# Patient Record
Sex: Female | Born: 1995 | Race: White | Hispanic: No | Marital: Single | Smoking: Never smoker
Health system: Southern US, Community
[De-identification: ages and names within clinical notes are randomized; demographics above are authoritative.]

## PROBLEM LIST (undated history)

## (undated) DIAGNOSIS — I10 Essential (primary) hypertension: Secondary | ICD-10-CM

---
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
## 2021-12-10 ENCOUNTER — Emergency Department: Payer: Managed Care, Other (non HMO)

## 2021-12-10 ENCOUNTER — Other Ambulatory Visit: Payer: Self-pay

## 2021-12-10 ENCOUNTER — Emergency Department
Admission: EM | Admit: 2021-12-10 | Discharge: 2021-12-10 | Disposition: A | Discharge status: Home / Self Care | Payer: Managed Care, Other (non HMO) | Attending: Emergency Medicine | Admitting: Emergency Medicine

## 2021-12-10 ENCOUNTER — Encounter: Payer: Self-pay | Admitting: Emergency Medicine

## 2021-12-10 DIAGNOSIS — Z20822 Contact with and (suspected) exposure to covid-19: Secondary | ICD-10-CM | POA: Insufficient documentation

## 2021-12-10 DIAGNOSIS — J029 Acute pharyngitis, unspecified: Secondary | ICD-10-CM | POA: Diagnosis present

## 2021-12-10 DIAGNOSIS — I10 Essential (primary) hypertension: Secondary | ICD-10-CM | POA: Insufficient documentation

## 2021-12-10 DIAGNOSIS — N9489 Other specified conditions associated with female genital organs and menstrual cycle: Secondary | ICD-10-CM | POA: Insufficient documentation

## 2021-12-10 DIAGNOSIS — J039 Acute tonsillitis, unspecified: Secondary | ICD-10-CM | POA: Diagnosis not present

## 2021-12-10 HISTORY — DX: Essential (primary) hypertension: I10

## 2021-12-10 LAB — CBC WITH DIFFERENTIAL/PLATELET
Abs Immature Granulocytes: 0.05 10*3/uL (ref 0.00–0.07)
Basophils Absolute: 0 10*3/uL (ref 0.0–0.1)
Basophils Relative: 0 %
Eosinophils Absolute: 0 10*3/uL (ref 0.0–0.5)
Eosinophils Relative: 0 %
HCT: 43 % (ref 36.0–46.0)
Hemoglobin: 15.1 g/dL — ABNORMAL HIGH (ref 12.0–15.0)
Immature Granulocytes: 0 %
Lymphocytes Relative: 10 %
Lymphs Abs: 1.1 10*3/uL (ref 0.7–4.0)
MCH: 29.2 pg (ref 26.0–34.0)
MCHC: 35.1 g/dL (ref 30.0–36.0)
MCV: 83 fL (ref 80.0–100.0)
Monocytes Absolute: 0.6 10*3/uL (ref 0.1–1.0)
Monocytes Relative: 6 %
Neutro Abs: 9.4 10*3/uL — ABNORMAL HIGH (ref 1.7–7.7)
Neutrophils Relative %: 84 %
Platelets: 239 10*3/uL (ref 150–400)
RBC: 5.18 MIL/uL — ABNORMAL HIGH (ref 3.87–5.11)
RDW: 11.9 % (ref 11.5–15.5)
WBC: 11.3 10*3/uL — ABNORMAL HIGH (ref 4.0–10.5)
nRBC: 0 % (ref 0.0–0.2)

## 2021-12-10 LAB — BASIC METABOLIC PANEL
Anion gap: 7 (ref 5–15)
BUN: 7 mg/dL (ref 6–20)
CO2: 26 mmol/L (ref 22–32)
Calcium: 9 mg/dL (ref 8.9–10.3)
Chloride: 102 mmol/L (ref 98–111)
Creatinine, Ser: 0.55 mg/dL (ref 0.44–1.00)
GFR, Estimated: >60 mL/min (ref >60)
Glucose, Bld: 124 mg/dL — ABNORMAL HIGH (ref 70–99)
Potassium: 3.8 mmol/L (ref 3.5–5.1)
Sodium: 135 mmol/L (ref 135–145)

## 2021-12-10 LAB — RESP PANEL BY RT-PCR (FLU A&B, COVID) ARPGX2
Influenza A by PCR: NEGATIVE
Influenza B by PCR: NEGATIVE
SARS Coronavirus 2 by RT PCR: NEGATIVE

## 2021-12-10 LAB — HCG, QUANTITATIVE, PREGNANCY: hCG, Beta Chain, Quant, S: <1 m[IU]/mL (ref <5)

## 2021-12-10 MED ORDER — IOHEXOL 300 MG/ML  SOLN
75.0000 mL | Freq: Once | INTRAMUSCULAR | Status: AC | PRN
  Administered 2021-12-10: 14:00:00 75 mL via INTRAVENOUS
  Filled 2021-12-10: qty 75

## 2021-12-10 MED ORDER — DEXAMETHASONE SODIUM PHOSPHATE 10 MG/ML IJ SOLN
12.0000 mg | Freq: Once | INTRAMUSCULAR | Status: AC
  Administered 2021-12-10: 14:00:00 12 mg via INTRAVENOUS
  Filled 2021-12-10: qty 2

## 2021-12-10 MED ORDER — AMOXICILLIN-POT CLAVULANATE 875-125 MG PO TABS: 1.0000 | ORAL_TABLET | Freq: Two times a day (BID) | ORAL | 0 refills | Status: AC

## 2021-12-10 MED ORDER — KETOROLAC TROMETHAMINE 30 MG/ML IJ SOLN
30.0000 mg | Freq: Once | INTRAMUSCULAR | Status: AC
  Administered 2021-12-10: 14:00:00 30 mg via INTRAVENOUS
  Filled 2021-12-10: qty 1

## 2021-12-10 NOTE — ED Provider Notes (Signed)
[facility] Emergency Department Provider Note    ____________________________________________   Event Date/Time   First MD Initiated Contact with Patient 12/10/21 1216     (approximate)  I have reviewed the triage vital signs and the nursing notes.   HISTORY  Chief Complaint Sore Throat   HPI Erika Acosta is a 25 y.o. female, history of hypertension, presents to the emergency department for evaluation of sore throat.  Patient states that she was treated for strep throat a few weeks ago.  Her infection resolved with a course of amoxicillin, however she states that her infection has returned and she continues to have tonsillar swelling.  Additionally reports chills and body aches.  Denies chest pain, shortness of breath, abdominal pain, or urinary symptoms.  History limited by: No limitations  Past Medical History:  Diagnosis Date   Hypertension     There are no problems to display for this patient.   History reviewed. No pertinent surgical history.  Prior to Admission medications   Medication Sig Start Date End Date Taking? Authorizing Provider  amoxicillin-clavulanate (AUGMENTIN) 875-125 MG tablet Take 1 tablet by mouth 2 (two) times daily for 10 days. 12/10/21 12/20/21 Yes Varney Daily, PA    Allergies Patient has no allergy information on record.  No family history on file.  Social History    Review of Systems  Constitutional: Positive for fever/chills negative for weight loss, or fatigue.  Eyes: Negative for visual changes or discharge.  ENT: Positive for sore throat negative for congestion Gastrointestinal: Negative for abdominal pain, nausea/vomiting, or diarrhea.  Genitourinary: Negative for dysuria or hematuria.  Musculoskeletal: Negative for back pain or joint pain.  Skin: Negative for rashes or lesions.  Neurological: Negative for headache, syncope, dizziness, tremors, or numbness/tingling.   10-point ROS otherwise  negative. ____________________________________________   PHYSICAL EXAM:  VITAL SIGNS: ED Triage Vitals  Enc Vitals Group     BP 12/10/21 1217 (!) 189/134     Pulse Rate 12/10/21 1217 (!) 142     Resp 12/10/21 1217 20     Temp --      Temp src --      SpO2 12/10/21 1217 99 %     Weight 12/10/21 1215 300 lb (136.1 kg)     Height 12/10/21 1215 5\' 8"  (1.727 m)     Head Circumference --      Peak Flow --      Pain Score 12/10/21 1214 6     Pain Loc --      Pain Edu? --      Excl. in GC? --     Physical Exam Vitals and nursing note reviewed.  Constitutional:      General: She is not in acute distress.    Appearance: Normal appearance. She is not ill-appearing.  HENT:     Head: Normocephalic and atraumatic.     Right Ear: External ear normal.     Left Ear: External ear normal.     Nose: Nose normal.     Mouth/Throat:     Mouth: Mucous membranes are moist.     Pharynx: Oropharynx is clear.     Comments: Significant tonsillar swelling bilaterally with some exudates.  Uvula midline. Eyes:     Conjunctiva/sclera: Conjunctivae normal.     Pupils: Pupils are equal, round, and reactive to light.  Cardiovascular:     Rate and Rhythm: Normal rate and regular rhythm.  Pulmonary:     Effort: Pulmonary effort is  normal. No respiratory distress.     Breath sounds: Normal breath sounds. No wheezing, rhonchi or rales.  Abdominal:     General: Abdomen is flat. There is no distension.     Palpations: Abdomen is soft.  Musculoskeletal:        General: No deformity. Normal range of motion.     Cervical back: Normal range of motion and neck supple.  Skin:    General: Skin is warm and dry.  Neurological:     General: No focal deficit present.     Mental Status: She is alert. Mental status is at baseline.  Psychiatric:        Mood and Affect: Mood normal.        Behavior: Behavior normal.        Thought Content: Thought content normal.        Judgment: Judgment normal.      ____________________________________________    LABS  (all labs ordered are listed, but only abnormal results are displayed)  Labs Reviewed  BASIC METABOLIC PANEL - Abnormal; Notable for the following components:      Result Value   Glucose, Bld 124 (*)    All other components within normal limits  CBC WITH DIFFERENTIAL/PLATELET - Abnormal; Notable for the following components:   WBC 11.3 (*)    RBC 5.18 (*)    Hemoglobin 15.1 (*)    Neutro Abs 9.4 (*)    All other components within normal limits  RESP PANEL BY RT-PCR (FLU A&B, COVID) ARPGX2  HCG, QUANTITATIVE, PREGNANCY     ____________________________________________   EKG Not applicable.   ____________________________________________    RADIOLOGY I personally viewed and evaluated these images as part of my medical decision making, as well as reviewing the written report by the radiologist.  ED Provider Interpretation: I agree with the interpretation of the radiologist.  CT Soft Tissue Neck W Contrast  Result Date: 12/10/2021 CLINICAL DATA:  Epiglottitis or tonsillitis suspected EXAM: CT NECK WITH CONTRAST TECHNIQUE: Multidetector CT imaging of the neck was performed using the standard protocol following the bolus administration of intravenous contrast. CONTRAST:  51mL OMNIPAQUE IOHEXOL 300 MG/ML  SOLN COMPARISON:  None. FINDINGS: Pharynx and larynx: The nasal cavity and nasopharynx are unremarkable. The bilateral palatine tonsils are swollen, right more than left. There is no peripherally enhancing fluid collection to suggest organized abscess formation. There is partial effacement of the oropharyngeal airway. The hypopharynx and larynx are unremarkable. The vocal folds are normal. The parapharyngeal spaces are clear. Salivary glands: The parotid and submandibular glands are unremarkable. Thyroid: Unremarkable. Lymph nodes: There are prominent bilateral cervical chain lymph nodes measuring up to 1.6 cm on the right  and 1.4 cm on the left at level IIA, likely reactive. Vascular: Unremarkable. Limited intracranial: The imaged intracranial compartment is unremarkable. Visualized orbits: Globes and orbits are unremarkable. Mastoids and visualized paranasal sinuses: The paranasal sinuses are clear. The mastoid air cells are clear. Skeleton: There is no acute osseous abnormality or aggressive osseous lesion. Upper chest: The imaged lung apices are clear. Other: None. IMPRESSION: Enlarged bilateral palatine tonsils suggesting tonsillitis in the appropriate clinical setting. No evidence of abscess formation. Scattered enlarged bilateral cervical chain lymph nodes are likely reactive. Electronically Signed   By: Valetta Mole M.D.   On: 12/10/2021 14:39    ____________________________________________   PROCEDURES  Procedures   Medications  ketorolac (TORADOL) 30 MG/ML injection 30 mg (30 mg Intravenous Given 12/10/21 1357)  dexamethasone (DECADRON) injection 12 mg (12  mg Intravenous Given 12/10/21 1357)  iohexol (OMNIPAQUE) 300 MG/ML solution 75 mL (75 mLs Intravenous Contrast Given 12/10/21 1413)    Critical Care performed: No  ____________________________________________   INITIAL IMPRESSION / ASSESSMENT AND PLAN / ED COURSE  Pertinent labs & imaging results that were available during my care of the patient were reviewed by me and considered in my medical decision making (see chart for details).        Erika Acosta is a 25 y.o. female, history of hypertension, presents to the emergency department for evaluation of sore throat.  Patient states that she was treated for strep throat a few weeks ago.  Her infection resolved with a course of amoxicillin, however she states that her infection has returned and she continues to have tonsillar swelling.  Additionally reports chills and body aches.  Denies chest pain, shortness of breath, abdominal pain, or urinary symptoms.  Differentials include, but not limited  to: Serious: epiglottitis, peritonsillar abscess, retropharyngeal abscess, Ludwig angina, gonorrhea/chlamydia Common: viral pharyngitis, strep pharyngitis, mononucleosis, allergies, viral syndrome (influenza, COVID), reflux esophagitis   Upon entering the room, patient appears well.  She is sitting upright comfortably in the bed.  No apparent distress.  Physical exam is notable for significant tonsillar swelling bilaterally with some exudates.  Uvula midline.  Patient is hypertensive at 162/90 and mildly tachycardic at 105.  She is afebrile here today.  Patient was initially treated with 30 mg ketorolac and 12 mg dexamethasone.  CBC shows leukocytosis at 11.3.  BMP unremarkable.  Negative beta hCG.  Respiratory panel is negative for COVID-19 or influenza.  Patient was previously screened for mono and chlamydia/gonorrhea, which were all negative.  CT soft tissue neck with contrast shows tonsillar swelling.  No evidence of abscess formation.  Given the patient's history, physical exam, labs, and imaging, I suspect that this patient is having a recurring strep pharyngitis infection.  I discussed the findings with the patient.  We will plan to discharge this patient with a prescription for Augmentin.  Provide the patient with anticipatory guidance and strict return precautions.  I encouraged the patient to return to the emergency department at any time if her symptoms worsen or she begins to experience any new symptoms      ____________________________________________   FINAL CLINICAL IMPRESSION(S) / ED DIAGNOSES  Final diagnoses:  Tonsillitis     NEW MEDICATIONS STARTED DURING THIS VISIT:  ED Discharge Orders          Ordered    amoxicillin-clavulanate (AUGMENTIN) 875-125 MG tablet  2 times daily        12/10/21 1518             Note:  This document was prepared using Dragon voice recognition software and may include unintentional dictation errors.    Teodoro Spray,  [place] 12/11/21 [id number]    Lucrezia Starch, MD 12/11/21 1034

## 2021-12-10 NOTE — ED Triage Notes (Signed)
Pt reports had strep throat and was given amoxicillin for 10 days. Pt reports was better but then her sore throat came back and now she has bodyaches.

## 2021-12-10 NOTE — ED Notes (Signed)
Patient requests to hold off on the strep swab until she speaks with a provider.

## 2021-12-10 NOTE — ED Notes (Signed)
Pt to ED for 2/5 week group A strep throat with now swollen tonsil R neck. States tonsil swelling has been since 11/30 with swelling up and down but never resolved.  Pt states EDP told her we do not need to swab again. Will confirm with EDP.

## 2021-12-10 NOTE — ED Notes (Signed)
Lab called to add on hCG to blood work already sent

## 2021-12-10 NOTE — Discharge Instructions (Addendum)
-  Please return to the emergency department if you experience new or worsening symptoms. -Please take antibiotics as prescribed and finish the entire course. -Take Tylenol or ibuprofen as needed for pain.

## 2022-02-22 ENCOUNTER — Other Ambulatory Visit: Payer: Self-pay | Admitting: Student

## 2022-02-22 DIAGNOSIS — I998 Other disorder of circulatory system: Secondary | ICD-10-CM

## 2022-02-23 ENCOUNTER — Other Ambulatory Visit
Admission: RE | Admit: 2022-02-23 | Discharge: 2022-02-23 | Disposition: A | Discharge status: Home / Self Care | Payer: Managed Care, Other (non HMO) | Source: Ambulatory Visit | Attending: Student | Admitting: Student

## 2022-02-23 DIAGNOSIS — Z03818 Encounter for observation for suspected exposure to other biological agents ruled out: Secondary | ICD-10-CM | POA: Diagnosis present

## 2022-02-23 DIAGNOSIS — R000 Tachycardia, unspecified: Secondary | ICD-10-CM | POA: Insufficient documentation

## 2022-02-23 LAB — TROPONIN I (HIGH SENSITIVITY): Troponin I (High Sensitivity): <2 ng/L (ref <18)

## 2022-02-23 LAB — D-DIMER, QUANTITATIVE: D-Dimer, Quant: 0.45 ug/mL-FEU (ref 0.00–0.50)

## 2022-02-28 ENCOUNTER — Other Ambulatory Visit: Payer: Self-pay

## 2022-02-28 ENCOUNTER — Ambulatory Visit
Admission: RE | Admit: 2022-02-28 | Discharge: 2022-02-28 | Disposition: A | Discharge status: Home / Self Care | Payer: Managed Care, Other (non HMO) | Source: Ambulatory Visit | Attending: Student | Admitting: Student

## 2022-02-28 DIAGNOSIS — I998 Other disorder of circulatory system: Secondary | ICD-10-CM | POA: Insufficient documentation

## 2022-09-10 ENCOUNTER — Emergency Department
Admission: EM | Admit: 2022-09-10 | Discharge: 2022-09-10 | Disposition: A | Discharge status: Home / Self Care | Payer: Managed Care, Other (non HMO) | Attending: Emergency Medicine | Admitting: Emergency Medicine

## 2022-09-10 ENCOUNTER — Encounter: Payer: Self-pay | Admitting: Emergency Medicine

## 2022-09-10 ENCOUNTER — Other Ambulatory Visit: Payer: Self-pay

## 2022-09-10 DIAGNOSIS — R55 Syncope and collapse: Secondary | ICD-10-CM

## 2022-09-10 DIAGNOSIS — I10 Essential (primary) hypertension: Secondary | ICD-10-CM | POA: Diagnosis not present

## 2022-09-10 LAB — CBC
HCT: 40.3 % (ref 36.0–46.0)
Hemoglobin: 13.6 g/dL (ref 12.0–15.0)
MCH: 29.7 pg (ref 26.0–34.0)
MCHC: 33.7 g/dL (ref 30.0–36.0)
MCV: 88 fL (ref 80.0–100.0)
Platelets: 239 10*3/uL (ref 150–400)
RBC: 4.58 MIL/uL (ref 3.87–5.11)
RDW: 12.6 % (ref 11.5–15.5)
WBC: 7.6 10*3/uL (ref 4.0–10.5)
nRBC: 0 % (ref 0.0–0.2)

## 2022-09-10 LAB — BASIC METABOLIC PANEL
Anion gap: 8 (ref 5–15)
BUN: 9 mg/dL (ref 6–20)
CO2: 28 mmol/L (ref 22–32)
Calcium: 9 mg/dL (ref 8.9–10.3)
Chloride: 102 mmol/L (ref 98–111)
Creatinine, Ser: 0.72 mg/dL (ref 0.44–1.00)
GFR, Estimated: >60 mL/min (ref >60)
Glucose, Bld: 107 mg/dL — ABNORMAL HIGH (ref 70–99)
Potassium: 3.8 mmol/L (ref 3.5–5.1)
Sodium: 138 mmol/L (ref 135–145)

## 2022-09-10 NOTE — ED Provider Notes (Signed)
[facility] Provider Note    Event Date/Time   First MD Initiated Contact with Patient 09/10/22 1418     (approximate)  History   Chief Complaint: Weakness and Dizziness  HPI  Erika Acosta is a 26 y.o. female with a past medical history of hypertension who presents to the emergency department for a near syncopal episode.  According to the patient she was at work today shortly after lunch when she began feeling dizzy and lightheaded.  Patient states she felt like she was, pass out so she laid down on the ground.  Patient states this has been an ongoing issue over the past year or more.  States she is followed up with her doctor as well as cardiology and currently sees Dr. Juliann Pares.  Patient states she has had a Holter monitor that has shown normal results as well as some blood work with no findings yet.  Patient denies any chest pain.  Denies any shortness of breath.  Patient currently denies any symptoms, states she feels like she is back to normal.  Physical Exam   Triage Vital Signs: ED Triage Vitals  Enc Vitals Group     BP 09/10/22 1407 (!) 141/89     Pulse Rate 09/10/22 1407 87     Resp 09/10/22 1407 20     Temp 09/10/22 1407 98 F (36.7 C)     Temp Source 09/10/22 1407 Oral     SpO2 09/10/22 1407 97 %     Weight 09/10/22 1400 270 lb (122.5 kg)     Height 09/10/22 1400 5\' 7"  (1.702 m)     Head Circumference --      Peak Flow --      Pain Score 09/10/22 1400 0     Pain Loc --      Pain Edu? --      Excl. in GC? --     Most recent vital signs: Vitals:   09/10/22 1407  BP: (!) 141/89  Pulse: 87  Resp: 20  Temp: 98 F (36.7 C)  SpO2: 97%    General: Awake, no distress.  CV:  Good peripheral perfusion.   Resp:  Normal effort.   Abd:  No distention.     ED Results / Procedures / Treatments   EKG  EKG viewed and interpreted by myself shows a normal sinus rhythm at 79 bpm with a narrow QRS, normal axis, normal intervals, no concerning  ST changes.   MEDICATIONS ORDERED IN ED: Medications - No data to display   IMPRESSION / MDM / ASSESSMENT AND PLAN / ED COURSE  I reviewed the triage vital signs and the nursing notes.  Patient's presentation is most consistent with acute presentation with potential threat to life or bodily function.  Patient presents emergency department after near syncopal episode.  Overall the patient appears well, no distress.  Patient states this has been an ongoing issue over the past 1+ years.  Patient has seen her doctor and is currently seeing a cardiologist for the work-up.  Patient's work-up today is reassuring including normal chemistry, reassuring CBC and a normal EKG.  Patient denies any chest pain or shortness of breath.  Patient appears well on my examination.  I discussed the possible causes such as a vasovagal event, dehydration, pots.  Ultimately recommended that the patient follow back up with her cardiologist.  Patient agreeable to plan.  Patient will be discharged with cardiology follow-up.  FINAL CLINICAL IMPRESSION(S) / ED DIAGNOSES  Near syncope  Note:  This document was prepared using Systems analyst and may include unintentional dictation errors.   Harvest Dark, MD 09/10/22 [phone]

## 2022-09-10 NOTE — ED Triage Notes (Signed)
Pt had near syncopal episode today after lunch. 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 Pt states this issue has been going on for months and cardiologist is unable to find out what the problem is. Pt states dizziness and weakness comes and goes everyday but today seems to be  worse.

## 2022-09-24 ENCOUNTER — Other Ambulatory Visit: Payer: Self-pay | Admitting: Physician Assistant

## 2022-09-24 DIAGNOSIS — R55 Syncope and collapse: Secondary | ICD-10-CM

## 2022-10-10 ENCOUNTER — Ambulatory Visit
Admission: RE | Admit: 2022-10-10 | Discharge: 2022-10-10 | Disposition: A | Discharge status: Home / Self Care | Payer: Managed Care, Other (non HMO) | Source: Ambulatory Visit | Attending: Physician Assistant | Admitting: Physician Assistant

## 2022-10-10 DIAGNOSIS — R55 Syncope and collapse: Secondary | ICD-10-CM

## 2023-08-25 IMAGING — US US RENAL ARTERY STENOSIS
1 series · 13 of 25 positions shown · non-contrast
Comparison: CT renal stone protocol, 11/30/2013.

CLINICAL DATA: Poorly controlled blood pressure

EXAM:
RENAL/URINARY TRACT ULTRASOUND
RENAL DUPLEX DOPPLER ULTRASOUND

[Series 1: us renal artery duplex complete · 13 of 86 slices shown]
[im 1/86]
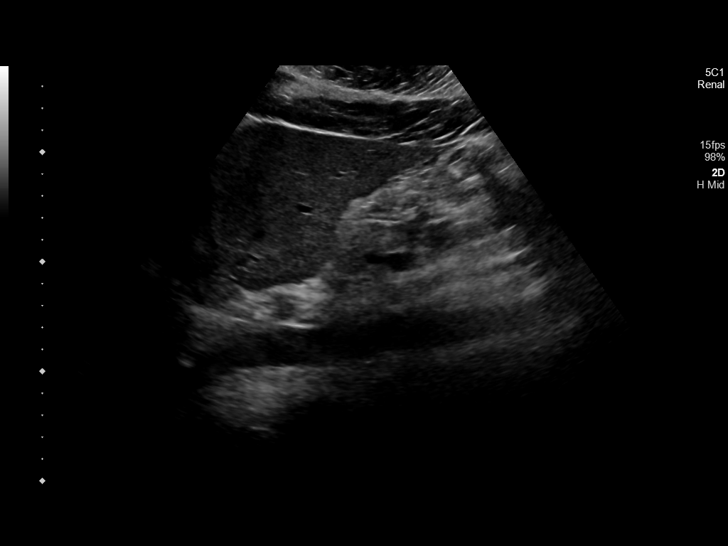
[im 8/86]
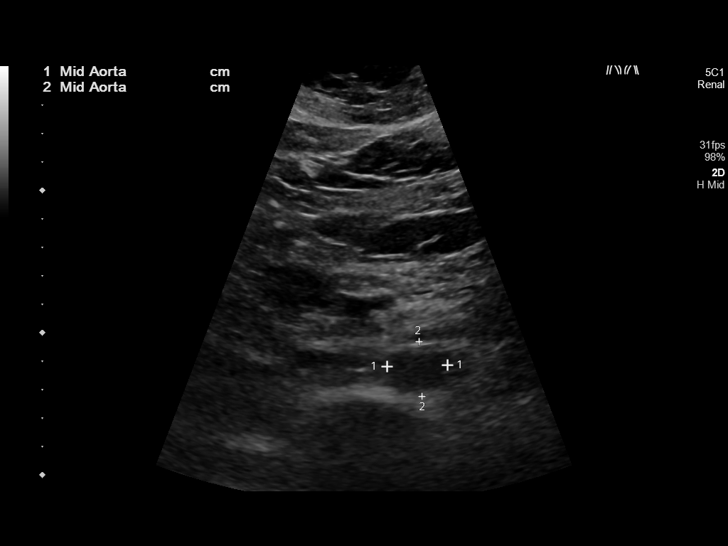
[im 15/86]
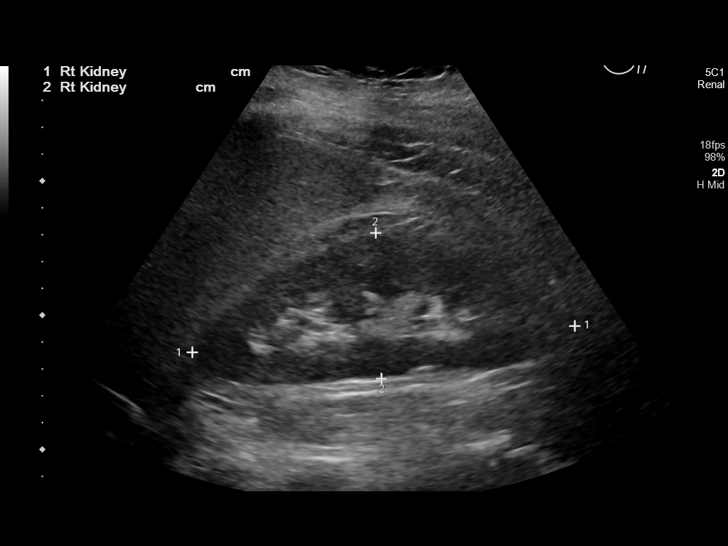
[im 22/86]
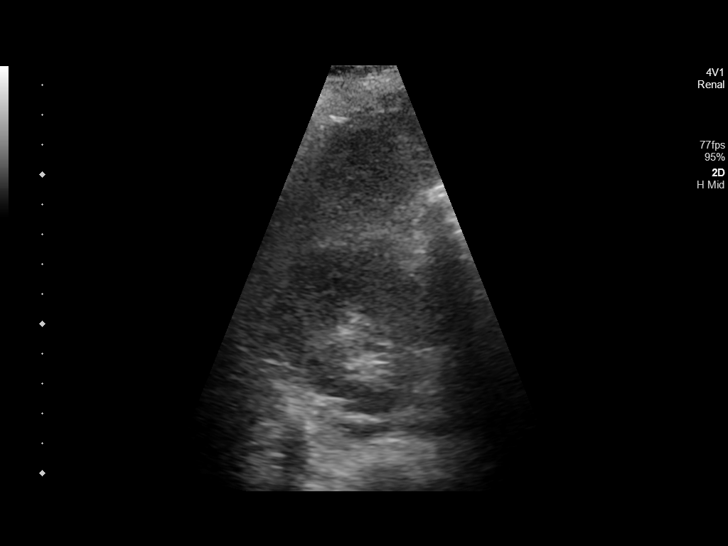
[im 29/86]
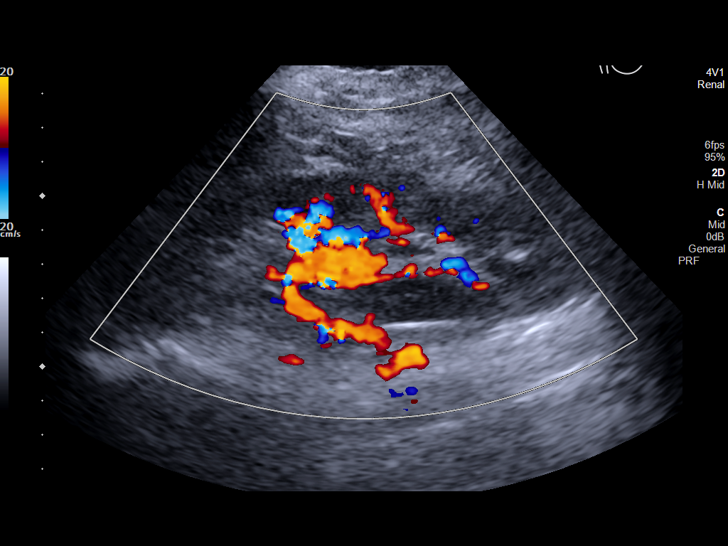
[im 36/86]
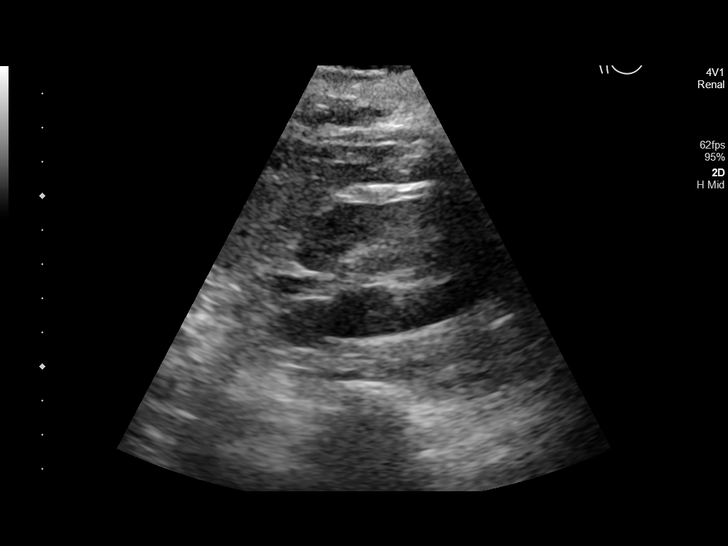
[im 43/86]
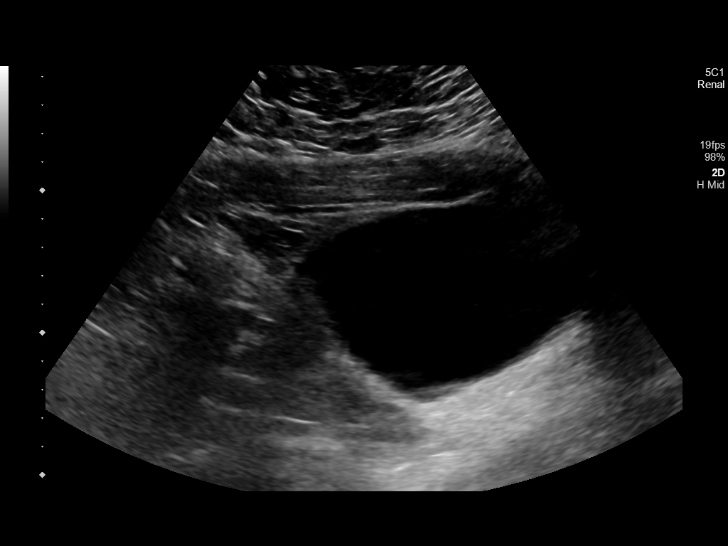
[im 50/86]
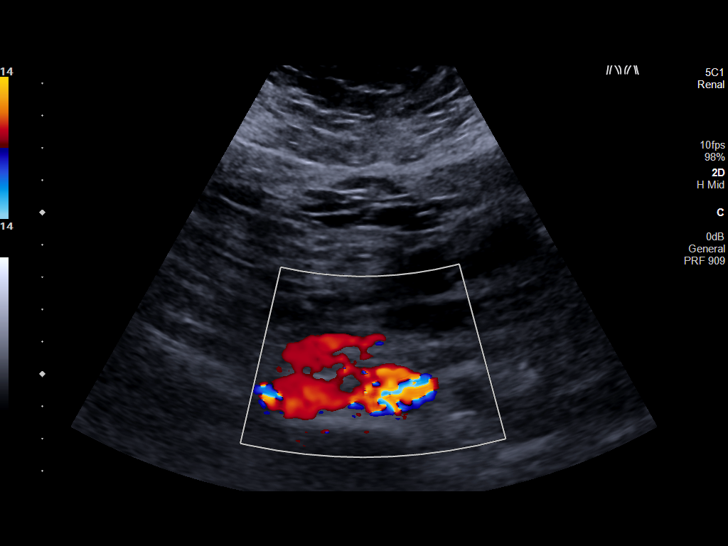
[im 57/86]
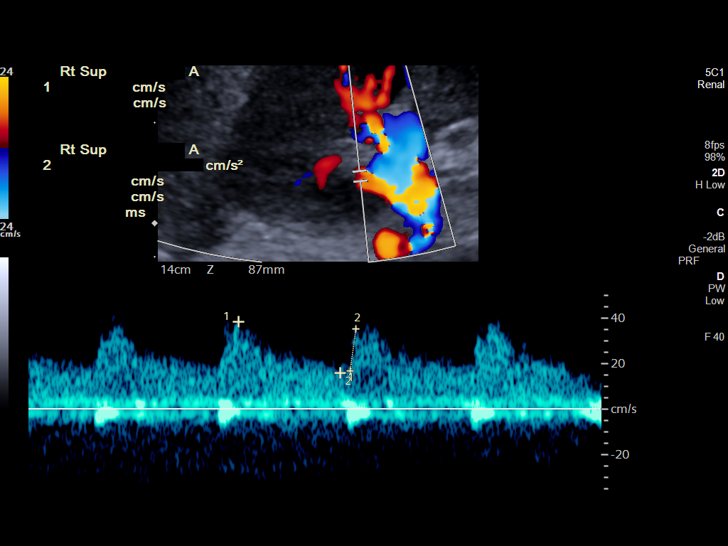
[im 64/86]
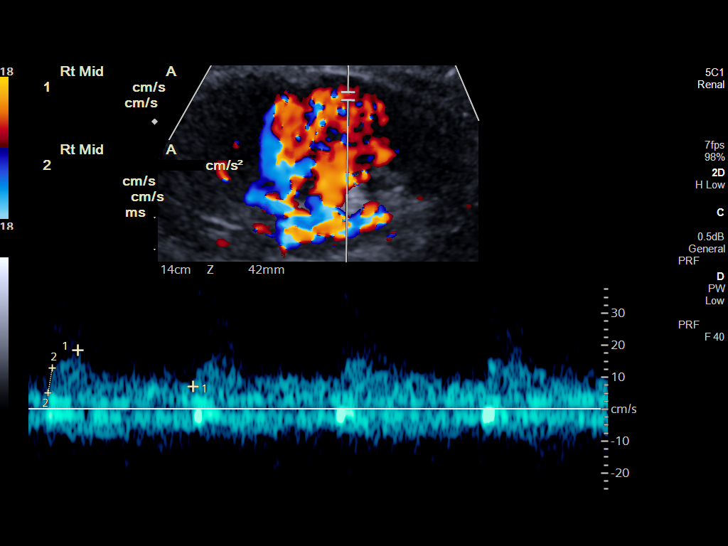
[im 71/86]
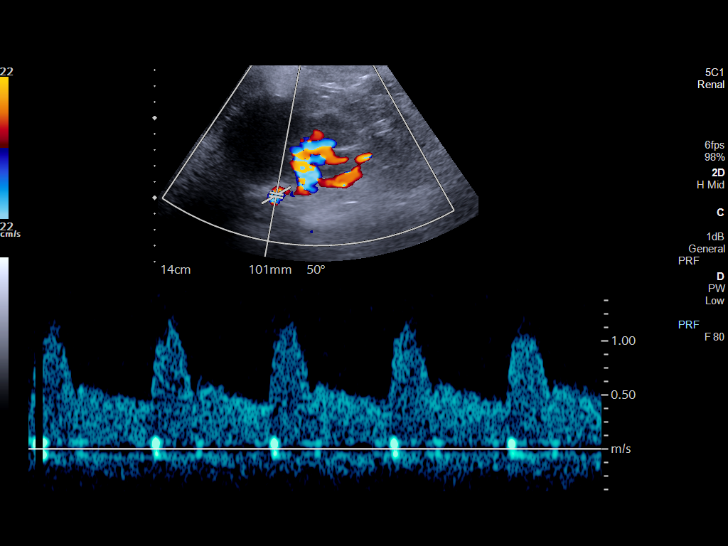
[im 78/86]
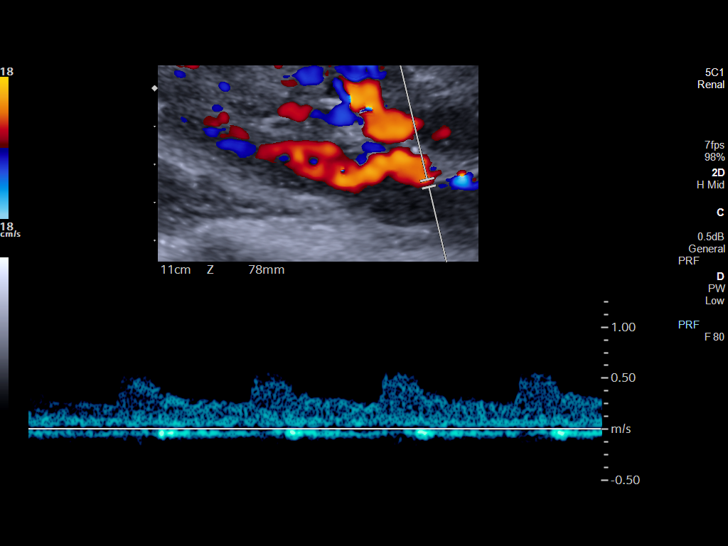
[im 86/86]
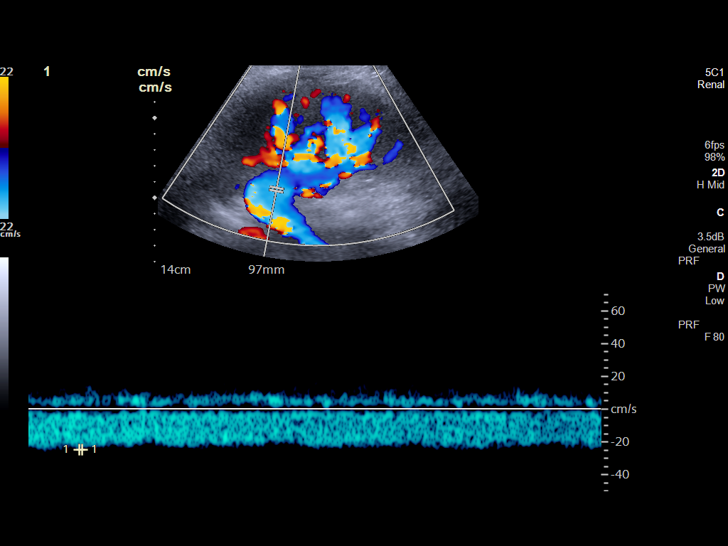

[13 of 25 positions shown; findings below may reference images not displayed]

FINDINGS: Right Kidney:

Length: 14.3 cm. Echogenicity within normal limits. No mass or
hydronephrosis visualized.

Left Kidney:

Length: 14.0 cm. Echogenicity within normal limits. No mass or
hydronephrosis visualized.

Bladder: Normal appearance of the urinary bladder. Postvoid imaging
was not acquired.

Incidental echogenic liver.

RENAL DUPLEX ULTRASOUND

Right Renal Artery Velocities:

Origin:  65 cm/sec

Mid:  106 cm/sec

Hilum:  82 cm/sec

Interlobar:  38 cm/sec

Arcuate:  27 cm/sec

The RIGHT renal vein is patent.

Left Renal Artery Velocities:

Origin:  120 cm/sec

Mid:  110 cm/sec

Hilum:  108 cm/sec

Interlobar:  59 cm/sec

Arcuate:  27 cm/sec

The LEFT renal vein is patent.

Aortic Velocity:  131 cm/sec

The imaged portions of the proximal, mid and distal abdominal aorta,
included bifurcation, is nonaneurysmal.

Right Renal-Aortic Ratios:

Origin:

Mid:

Hilum:

Interlobar:

Arcuate:

Left Renal-Aortic Ratios:

Origin:

Mid:

Hilum:

Interlobar:

Arcuate:
IMPRESSION: 1. Normal renal ultrasound.  No hydronephrosis.
2. No sonographic or Doppler evidence of renal artery stenosis.
3. Incidental echogenic liver. Findings most commonly seen in
hepatic steatosis, though may also represent hepatitis and/or
fibrosis.
